# Patient Record
Sex: Male | Born: 1965 | Race: White | Hispanic: No | Marital: Married | Smoking: Never smoker
Health system: Southern US, Community
[De-identification: ages and names within clinical notes are randomized; demographics above are authoritative.]

## PROBLEM LIST (undated history)

## (undated) HISTORY — PX: CRANIECTOMY FOR DEPRESSED SKULL FRACTURE: SHX5788

---

## 2019-08-21 ENCOUNTER — Emergency Department (HOSPITAL_BASED_OUTPATIENT_CLINIC_OR_DEPARTMENT_OTHER)
Admission: EM | Admit: 2019-08-21 | Discharge: 2019-08-21 | Disposition: A | Discharge status: Home / Self Care | Payer: BC Managed Care – PPO | Attending: Emergency Medicine | Admitting: Emergency Medicine

## 2019-08-21 ENCOUNTER — Encounter (HOSPITAL_BASED_OUTPATIENT_CLINIC_OR_DEPARTMENT_OTHER): Payer: Self-pay | Admitting: *Deleted

## 2019-08-21 ENCOUNTER — Emergency Department (HOSPITAL_BASED_OUTPATIENT_CLINIC_OR_DEPARTMENT_OTHER): Payer: BC Managed Care – PPO

## 2019-08-21 ENCOUNTER — Other Ambulatory Visit: Payer: Self-pay

## 2019-08-21 DIAGNOSIS — U071 COVID-19: Secondary | ICD-10-CM | POA: Insufficient documentation

## 2019-08-21 DIAGNOSIS — R0781 Pleurodynia: Secondary | ICD-10-CM | POA: Insufficient documentation

## 2019-08-21 DIAGNOSIS — R0602 Shortness of breath: Secondary | ICD-10-CM | POA: Diagnosis present

## 2019-08-21 DIAGNOSIS — Z79899 Other long term (current) drug therapy: Secondary | ICD-10-CM | POA: Diagnosis not present

## 2019-08-21 LAB — CBC WITH DIFFERENTIAL/PLATELET
Abs Immature Granulocytes: 0.05 10*3/uL (ref 0.00–0.07)
Basophils Absolute: 0 10*3/uL (ref 0.0–0.1)
Basophils Relative: 0 %
Eosinophils Absolute: 0 10*3/uL (ref 0.0–0.5)
Eosinophils Relative: 0 %
HCT: 36.1 % — ABNORMAL LOW (ref 39.0–52.0)
Hemoglobin: 12 g/dL — ABNORMAL LOW (ref 13.0–17.0)
Immature Granulocytes: 1 %
Lymphocytes Relative: 14 %
Lymphs Abs: 1.4 10*3/uL (ref 0.7–4.0)
MCH: 30.2 pg (ref 26.0–34.0)
MCHC: 33.2 g/dL (ref 30.0–36.0)
MCV: 90.9 fL (ref 80.0–100.0)
Monocytes Absolute: 0.6 10*3/uL (ref 0.1–1.0)
Monocytes Relative: 6 %
Neutro Abs: 8 10*3/uL — ABNORMAL HIGH (ref 1.7–7.7)
Neutrophils Relative %: 79 %
Platelets: 389 10*3/uL (ref 150–400)
RBC: 3.97 MIL/uL — ABNORMAL LOW (ref 4.22–5.81)
RDW: 13.4 % (ref 11.5–15.5)
Smear Review: NORMAL
WBC: 10 10*3/uL (ref 4.0–10.5)
nRBC: 0 % (ref 0.0–0.2)

## 2019-08-21 LAB — COMPREHENSIVE METABOLIC PANEL
ALT: 150 U/L — ABNORMAL HIGH (ref 0–44)
AST: 131 U/L — ABNORMAL HIGH (ref 15–41)
Albumin: 3 g/dL — ABNORMAL LOW (ref 3.5–5.0)
Alkaline Phosphatase: 93 U/L (ref 38–126)
Anion gap: 10 (ref 5–15)
BUN: 13 mg/dL (ref 6–20)
CO2: 25 mmol/L (ref 22–32)
Calcium: 8.2 mg/dL — ABNORMAL LOW (ref 8.9–10.3)
Chloride: 101 mmol/L (ref 98–111)
Creatinine, Ser: 1.02 mg/dL (ref 0.61–1.24)
GFR calc Af Amer: >60 mL/min (ref >60)
GFR calc non Af Amer: >60 mL/min (ref >60)
Glucose, Bld: 127 mg/dL — ABNORMAL HIGH (ref 70–99)
Potassium: 3.5 mmol/L (ref 3.5–5.1)
Sodium: 136 mmol/L (ref 135–145)
Total Bilirubin: 0.5 mg/dL (ref 0.3–1.2)
Total Protein: 7 g/dL (ref 6.5–8.1)

## 2019-08-21 LAB — PROCALCITONIN: Procalcitonin: 0.34 ng/mL

## 2019-08-21 LAB — TROPONIN I (HIGH SENSITIVITY): Troponin I (High Sensitivity): 4 ng/L (ref <18)

## 2019-08-21 LAB — LACTATE DEHYDROGENASE: LDH: 392 U/L — ABNORMAL HIGH (ref 98–192)

## 2019-08-21 LAB — FIBRINOGEN: Fibrinogen: >800 mg/dL — ABNORMAL HIGH (ref 210–475)

## 2019-08-21 LAB — LACTIC ACID, PLASMA: Lactic Acid, Venous: 1.8 mmol/L (ref 0.5–1.9)

## 2019-08-21 LAB — TRIGLYCERIDES: Triglycerides: 174 mg/dL — ABNORMAL HIGH (ref <150)

## 2019-08-21 LAB — C-REACTIVE PROTEIN: CRP: 18.1 mg/dL — ABNORMAL HIGH (ref <1.0)

## 2019-08-21 LAB — FERRITIN: Ferritin: 695 ng/mL — ABNORMAL HIGH (ref 24–336)

## 2019-08-21 LAB — D-DIMER, QUANTITATIVE: D-Dimer, Quant: 1.05 ug/mL-FEU — ABNORMAL HIGH (ref 0.00–0.50)

## 2019-08-21 MED ORDER — IOHEXOL 350 MG/ML SOLN
100.0000 mL | Freq: Once | INTRAVENOUS | Status: AC
  Administered 2019-08-21: 100 mL via INTRAVENOUS

## 2019-08-21 MED ORDER — ACETAMINOPHEN 325 MG PO TABS
650.0000 mg | ORAL_TABLET | Freq: Once | ORAL | Status: AC
  Administered 2019-08-21: 650 mg via ORAL
  Filled 2019-08-21: qty 2

## 2019-08-21 MED ORDER — DEXAMETHASONE SODIUM PHOSPHATE 10 MG/ML IJ SOLN
8.0000 mg | Freq: Once | INTRAMUSCULAR | Status: AC
  Administered 2019-08-21: 8 mg via INTRAVENOUS
  Filled 2019-08-21: qty 1

## 2019-08-21 MED ORDER — DEXAMETHASONE 6 MG PO TABS: 6.0000 mg | ORAL_TABLET | Freq: Every day | ORAL | 0 refills | Status: AC

## 2019-08-21 NOTE — ED Notes (Signed)
Pt ambulated around room walking in circles with pulse ox on. Hr increased to 99-104 range and SPO2 decreased to lowest of 90%

## 2019-08-21 NOTE — ED Triage Notes (Signed)
Covid  pos 08/11/2019, SOb x today

## 2019-08-21 NOTE — ED Provider Notes (Signed)
[facility] EMERGENCY DEPARTMENT Provider Note   CSN: [id number] Arrival date & time: 08/21/19  1830    History Chief Complaint  Patient presents with  . Shortness of Breath    Edward Webb is a 54 y.o. male with history significant for craniotomy for depressed skull fracture, known Covid positive on 08/11/2019 from [facility] test presents for evaluation of shortness of breath.  Patient states he has a cough productive of green, clear, brown sputum.  Possible episode of dark hemoptysis however patient is unsure.  He admits to some chest tightness and pleuritic chest pain however no exertional chest pain.  His chest pain does not radiate into his back, left arm, left jaw.  No associated diaphoresis, nausea, vomiting.  Patient states he has been monitoring his oxygen at home which will occasionally drop to 89% and come back up to 92%.  Has been able to tolerate p.o. intake at home.  Mild decreased appetite.  No abdominal pain, dysuria, lateral leg swelling, redness or warmth.  No prior history of PE or DVT.  Denies additional aggravating or alleviating factors.  History obtained from patient and past medical records.  No interpreter is used.  HPI     History reviewed. No pertinent past medical history.  There are no problems to display for this patient.   Past Surgical History:  Procedure Laterality Date  . CRANIECTOMY FOR DEPRESSED SKULL FRACTURE         History reviewed. No pertinent family history.  Social History   Tobacco Use  . Smoking status: Never Smoker  . Smokeless tobacco: Never Used  Substance Use Topics  . Alcohol use: Not Currently  . Drug use: Not Currently    Home Medications Prior to Admission medications   Medication Sig Start Date End Date Taking? Authorizing Provider  testosterone cypionate (DEPOTESTOSTERONE CYPIONATE) 200 MG/ML injection Inject 0.7m every 14 days. 12/27/15  Yes [provider]  dexamethasone (DECADRON) 6 MG tablet  Take 1 tablet (6 mg total) by mouth daily for 5 days. 08/21/19 08/26/19  Kimberlee Shoun A, PA-C    Allergies    Patient has no known allergies.  Review of Systems   Review of Systems  Constitutional: Positive for appetite change, fatigue and fever. Negative for activity change, chills, diaphoresis and unexpected weight change.  HENT: Negative.   Respiratory: Positive for cough and shortness of breath. Negative for apnea, choking, chest tightness, wheezing and stridor.   Cardiovascular: Positive for chest pain (Pleuritic, intermittent with cough). Negative for palpitations and leg swelling.  Gastrointestinal: Negative.   Genitourinary: Negative.   Musculoskeletal: Negative.   Skin: Negative.  Negative for color change.  Neurological: Negative.   All other systems reviewed and are negative.   Physical Exam Updated Vital Signs BP 121/75   Pulse 70   Temp (!) 100.8 F (38.2 C) (Oral)   Resp (!) 25   Ht 5' 10"  (1.778 m)   Wt 104.3 kg   SpO2 93%   BMI 33.00 kg/m   Physical Exam Vitals and nursing note reviewed.  Constitutional:      General: He is not in acute distress.    Appearance: He is not ill-appearing, toxic-appearing or diaphoretic.  HENT:     Head: Normocephalic and atraumatic.     Jaw: There is normal jaw occlusion.     Right Ear: Tympanic membrane, ear canal and external ear normal. There is no impacted cerumen. No hemotympanum. Tympanic membrane is not injected, scarred, perforated, erythematous, retracted or  bulging.     Left Ear: Tympanic membrane, ear canal and external ear normal. There is no impacted cerumen. No hemotympanum. Tympanic membrane is not injected, scarred, perforated, erythematous, retracted or bulging.     Ears:     Comments: No Mastoid tenderness.    Nose:     Comments: Clear rhinorrhea and congestion to bilateral nares.  No sinus tenderness.    Mouth/Throat:     Comments: Posterior oropharynx clear.  Mucous membranes moist.  Tonsils without  erythema or exudate.  Uvula midline without deviation.  No evidence of PTA or RPA.  No drooling, dysphasia or trismus.  Phonation normal. Neck:     Trachea: Trachea and phonation normal.     Meningeal: Brudzinski's sign and Kernig's sign absent.     Comments: No Neck stiffness or neck rigidity.  No meningismus.  No cervical lymphadenopathy. Cardiovascular:     Comments: No murmurs rubs or gallops. Pulmonary:     Comments: Clear to auscultation bilaterally without wheeze, rhonchi or rales.  No accessory muscle usage.  Able speak in full sentences. Abdominal:     Comments: Soft, nontender without rebound or guarding.  No CVA tenderness.  Musculoskeletal:     Comments: Moves all 4 extremities without difficulty.  Lower extremities without edema, erythema or warmth.  Skin:    Comments: Brisk capillary refill.  No rashes or lesions.  Neurological:     Mental Status: He is alert.     Comments: Ambulatory in department without difficulty.  Cranial nerves II through XII grossly intact.  No facial droop.  No aphasia.     ED Results / Procedures / Treatments   Labs (all labs ordered are listed, but only abnormal results are displayed) Labs Reviewed  CBC WITH DIFFERENTIAL/PLATELET - Abnormal; Notable for the following components:      Result Value   RBC 3.97 (*)    Hemoglobin 12.0 (*)    HCT 36.1 (*)    Neutro Abs 8.0 (*)    All other components within normal limits  COMPREHENSIVE METABOLIC PANEL - Abnormal; Notable for the following components:   Glucose, Bld 127 (*)    Calcium 8.2 (*)    Albumin 3.0 (*)    AST 131 (*)    ALT 150 (*)    All other components within normal limits  D-DIMER, QUANTITATIVE (NOT AT [facility]) - Abnormal; Notable for the following components:   D-Dimer, Quant 1.05 (*)    All other components within normal limits  LACTATE DEHYDROGENASE - Abnormal; Notable for the following components:   LDH 392 (*)    All other components within normal limits  FERRITIN -  Abnormal; Notable for the following components:   Ferritin 695 (*)    All other components within normal limits  TRIGLYCERIDES - Abnormal; Notable for the following components:   Triglycerides 174 (*)    All other components within normal limits  FIBRINOGEN - Abnormal; Notable for the following components:   Fibrinogen >800 (*)    All other components within normal limits  C-REACTIVE PROTEIN - Abnormal; Notable for the following components:   CRP 18.1 (*)    All other components within normal limits  CULTURE, BLOOD (ROUTINE X 2)  CULTURE, BLOOD (ROUTINE X 2)  LACTIC ACID, PLASMA  PROCALCITONIN  LACTIC ACID, PLASMA  TROPONIN I (HIGH SENSITIVITY)  TROPONIN I (HIGH SENSITIVITY)    EKG None  Radiology CT Angio Chest PE W/Cm &/Or Wo Cm  Result Date: 08/21/2019 CLINICAL DATA:  COVID-19 positive patient.  Shortness of breath. EXAM: CT ANGIOGRAPHY CHEST WITH CONTRAST TECHNIQUE: Multidetector CT imaging of the chest was performed using the standard protocol during bolus administration of intravenous contrast. Multiplanar CT image reconstructions and MIPs were obtained to evaluate the vascular anatomy. CONTRAST:  100 mL OMNIPAQUE IOHEXOL 350 MG/ML SOLN COMPARISON:  Single-view of the chest earlier today. FINDINGS: Cardiovascular: Satisfactory opacification of the pulmonary arteries to the segmental level. No evidence of pulmonary embolism. Normal heart size. No pericardial effusion. Mediastinum/Nodes: No enlarged mediastinal, hilar, or axillary lymph nodes. Thyroid gland, trachea, and esophagus demonstrate no significant findings. Lungs/Pleura: The patient has extensive multifocal airspace disease consistent with pneumonia. Trace right pleural effusion is noted. Upper Abdomen: No acute or focal abnormality. Musculoskeletal: Negative. Review of the MIP images confirms the above findings. IMPRESSION: Negative for pulmonary embolus. Extensive bilateral airspace disease consistent with pneumonia. The  appearance is in keeping with COVID-19 infection. Electronically Signed   By: Inge Rise M.D.   On: 08/21/2019 21:32   DG Chest Portable 1 View  Result Date: 08/21/2019 CLINICAL DATA:  Shortness of breath, COVID positive EXAM: PORTABLE CHEST 1 VIEW COMPARISON:  None. FINDINGS: Multifocal patchy/interstitial opacities in the bilateral upper lobes, left lower lobe/lingula, and right middle lobe. Relative sparing of the lateral right lung base. This appearance is compatible multifocal pneumonia in this patient with known COVID. No pleural effusion or pneumothorax. The heart is normal in size. IMPRESSION: Multifocal pneumonia in this patient with known COVID, as above. Electronically Signed   By: Julian Hy M.D.   On: 08/21/2019 19:02    Procedures Procedures (including critical care time)  Medications Ordered in ED Medications  acetaminophen (TYLENOL) tablet 650 mg (650 mg Oral Given 08/21/19 1921)  dexamethasone (DECADRON) injection 8 mg (8 mg Intravenous Given 08/21/19 2005)  iohexol (OMNIPAQUE) 350 MG/ML injection 100 mL (100 mLs Intravenous Contrast Given 08/21/19 2054)    ED Course  I have reviewed the triage vital signs and the nursing notes.  Pertinent labs & imaging results that were available during my care of the patient were reviewed by me and considered in my medical decision making (see chart for details).  54 year old male appears otherwise well presents for evaluation of shortness of breath.  Known Covid diagnosis on 06/09/2020.  Patient with low-grade temp, no tachycardia, tachypnea or hypoxia here in the ED.  States he has had possibly one episode of dark hemoptysis.  He is tolerating p.o. intake at home.  Heart and lungs clear.  No evidence of DVT on exam.  Abdomen soft, nontender.  He speaks in full sentences without difficulty.  No acute respiratory distress.  Given possible hemoptysis will obtain D-dimer and possibly CTA to rule a PE given known hypercoagulable state  and Covid patients.  Will ambulate with pulse ox also obtain some basic labs.  Labs and imaging personally reviewed and interpreted:  Clinical Course as of Aug 20 2246  Mon Aug 21, 2019  2109 No leukocytosis, hemoglobin 12.0, no prior to compare her patient denies melena or bright red blood per rectum.  CBC WITH DIFFERENTIAL(!) [BH]  2109 Hyperglycemia to 127, mild elevation in LFTs however bilirubin alk phos negative.  No right upper quadrant tenderness.   Comprehensive metabolic panel(!) [BH]  3235 4  Troponin I (High Sensitivity) [BH]  2110 1.8  Lactic acid, plasma [BH]  2110 Elevated, will order CTA chest  D-dimer, quantitative(!) [BH]  2110 Multifocal pneumonia likely viral due to Covid status.  DG Chest  Portable 1 View [BH]    Clinical Course User Index [BH] Kerly Rigsbee A, PA-C    Patient reassessed.No hypoxia with ambulation, SOB, can carry on conversation without difficulty.  Symptoms likely due to his Covid illness.  No evidence of PE.  Troponin negative here in the ED. EKG without ischemic changes per Dr. Vallery Ridge attending MD.   The patient has been appropriately medically screened and/or stabilized in the ED. I have low suspicion for any other emergent medical condition which would require further screening, evaluation or treatment in the ED or require inpatient management.  Patient is hemodynamically stable and in no acute distress.  Patient able to ambulate in department prior to ED.  Evaluation does not show acute pathology that would require ongoing or additional emergent interventions while in the emergency department or further inpatient treatment.  I have discussed the diagnosis with the patient and answered all questions.  Pain is been managed while in the emergency department and patient has no further complaints prior to discharge.  Patient is comfortable with plan discussed in room and is stable for discharge at this time.  I have discussed strict return  precautions for returning to the emergency department.  Patient was encouraged to follow-up with PCP/specialist refer to at discharge. MDM Rules/Calculators/A&P                      Edward Webb was evaluated in Emergency Department on 08/21/2019 for the symptoms described in the history of present illness. He was evaluated in the context of the global COVID-19 pandemic, which necessitated consideration that the patient might be at risk for infection with the SARS-CoV-2 virus that causes COVID-19. Institutional protocols and algorithms that pertain to the evaluation of patients at risk for COVID-19 are in a state of rapid change based on information released by regulatory bodies including the CDC and federal and state organizations. These policies and algorithms were followed during the patient's care in the ED. Final Clinical Impression(s) / ED Diagnoses Final diagnoses:  COVID-19  SOB (shortness of breath)    Rx / DC Orders ED Discharge Orders         Ordered    dexamethasone (DECADRON) 6 MG tablet  Daily     08/21/19 2233           Edker Punt A, PA-C 08/21/19 2248    Charlesetta Shanks, MD 08/30/19 [phone]

## 2019-08-21 NOTE — Discharge Instructions (Signed)
Take the steroids as prescribed.  If you notice any low oxygen level readings like we talked about during your visit today please seek reevaluation the emergency room

## 2019-08-21 NOTE — ED Notes (Signed)
Transported to CT 

## 2019-08-26 LAB — CULTURE, BLOOD (ROUTINE X 2)
Culture: NO GROWTH
Culture: NO GROWTH
Special Requests: ADEQUATE
Special Requests: ADEQUATE

## 2021-01-24 IMAGING — CT CT ANGIO CHEST
2 of 8 series · 19 of 36 positions shown · IV contrast (Omnipaque)
Comparison: Single-view of the chest earlier today.

CLINICAL DATA: QKDI0-W0 positive patient.  Shortness of breath.

EXAM:
CT ANGIOGRAPHY CHEST WITH CONTRAST
TECHNIQUE: Multidetector CT imaging of the chest was performed using the
standard protocol during bolus administration of intravenous
contrast. Multiplanar CT image reconstructions and MIPs were
obtained to evaluate the vascular anatomy.
CONTRAST:  100 mL OMNIPAQUE IOHEXOL 350 MG/ML SOLN

[Series 6: pe coronal mpr · coronal · 0.61mm/px · 1 of 151 slices shown]
[im 76/151  mediastinal]
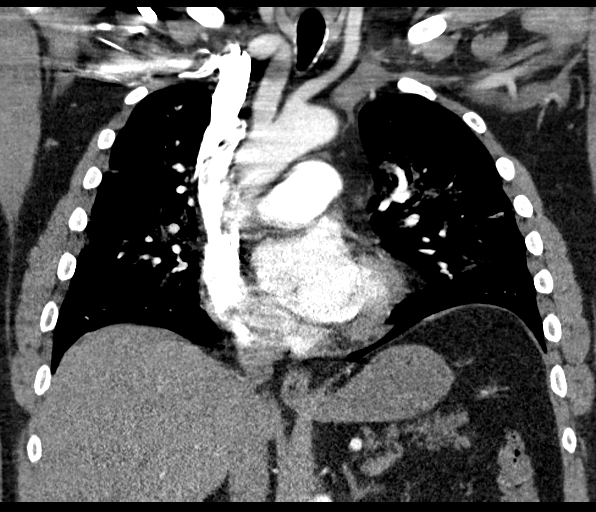

[Series 10: pe thins · axial · 0.78mm/px · z∈[+790,+1058]mm · 18 of 300 slices shown]
[im 16/300  lung]
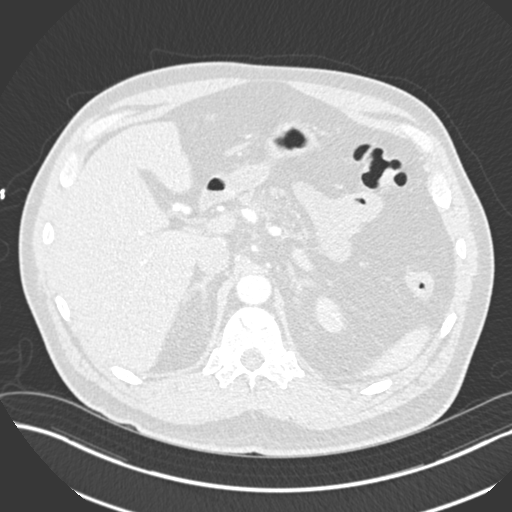
[im 32/300  mediastinal]
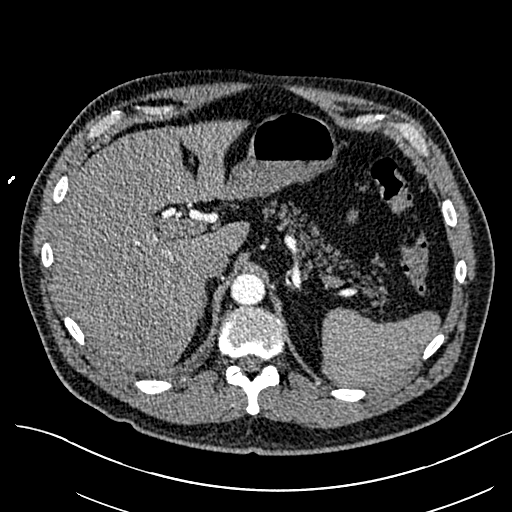
[im 48/300  lung]
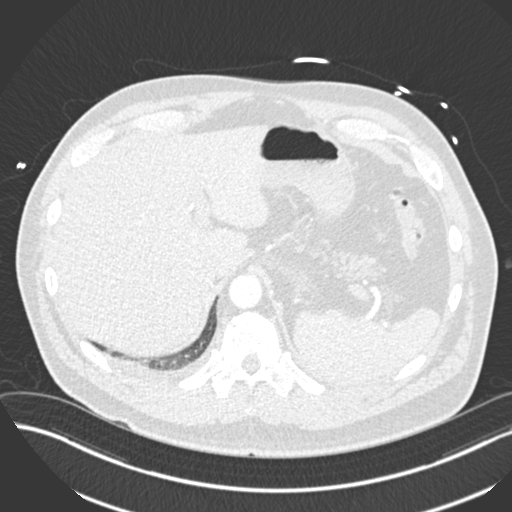
[im 63/300  mediastinal]
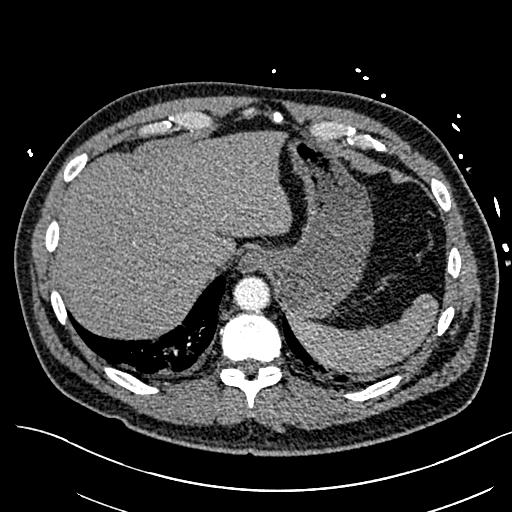
[im 79/300  lung]
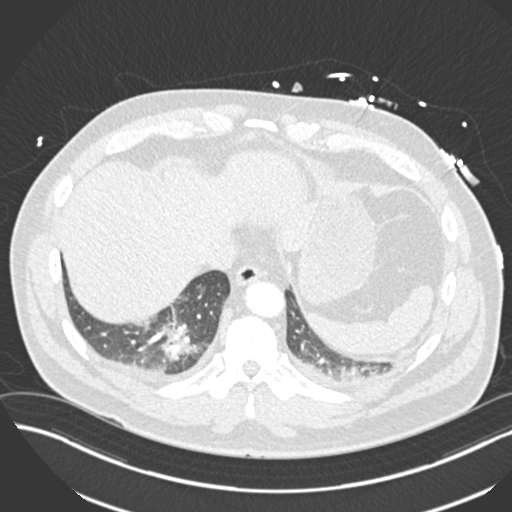
[im 95/300  mediastinal]
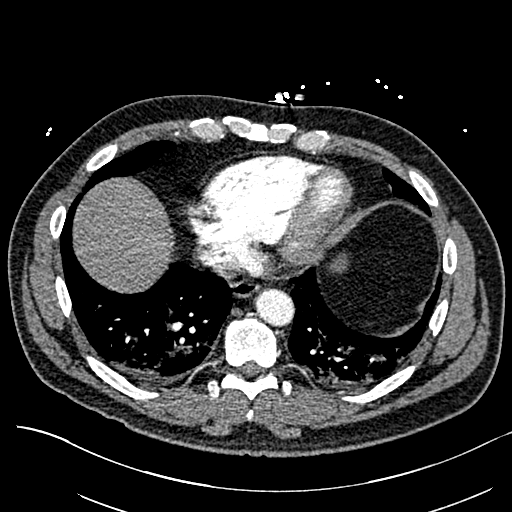
[im 111/300  lung]
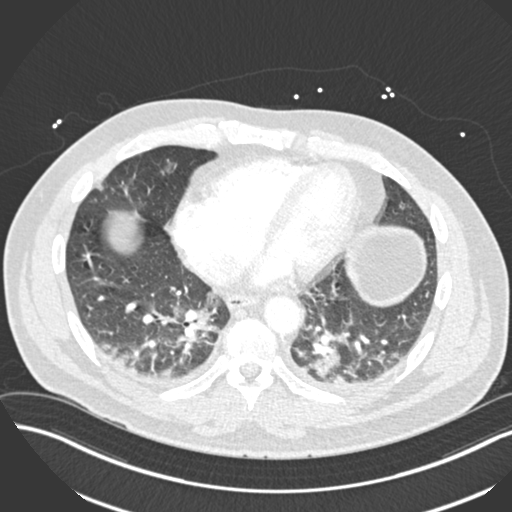
[im 126/300  mediastinal]
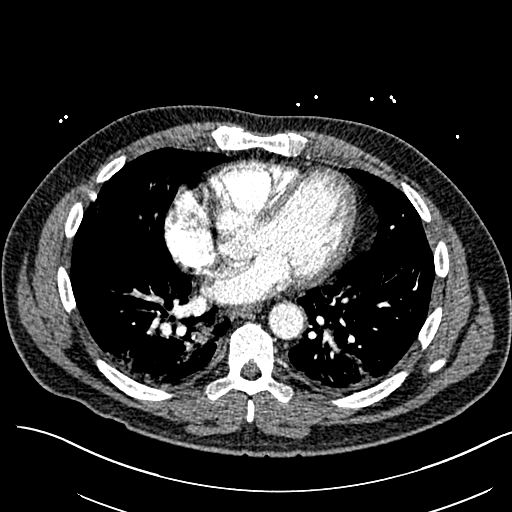
[im 142/300  lung]
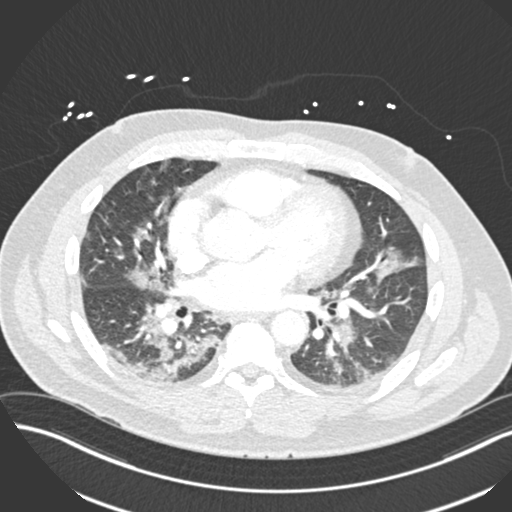
[im 158/300  mediastinal]
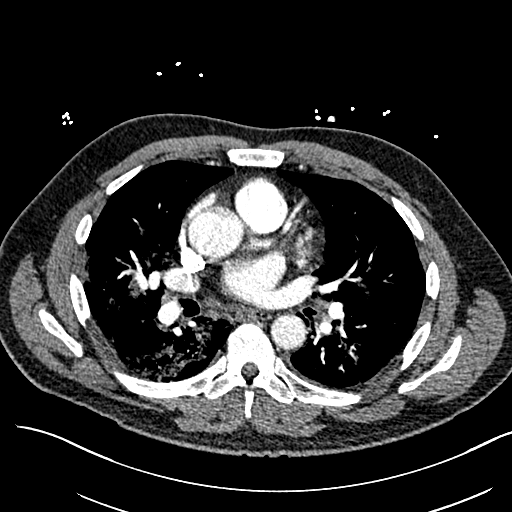
[im 174/300  lung]
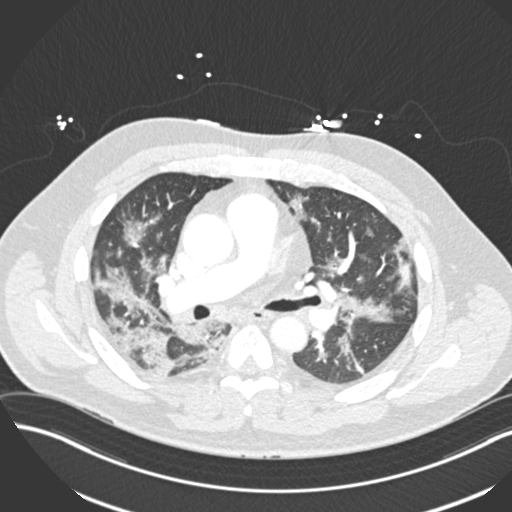
[im 189/300  mediastinal]
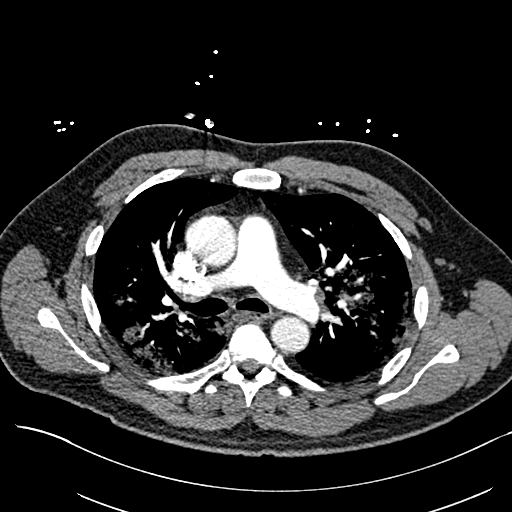
[im 205/300  lung]
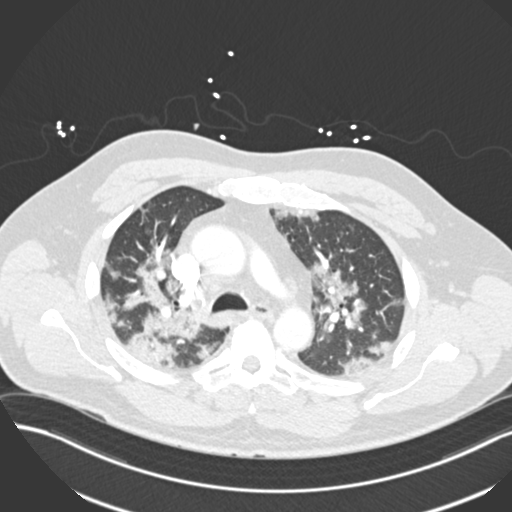
[im 221/300  mediastinal]
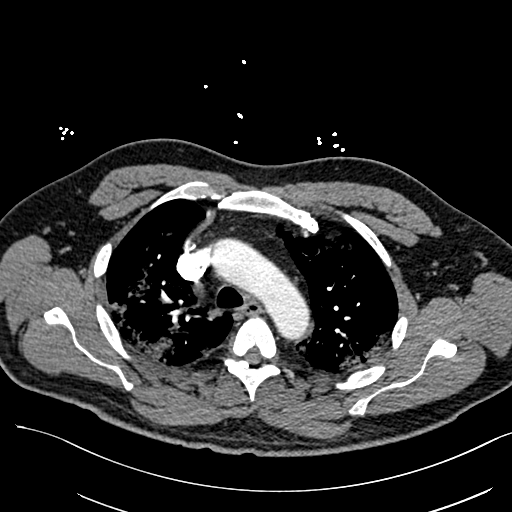
[im 237/300  lung]
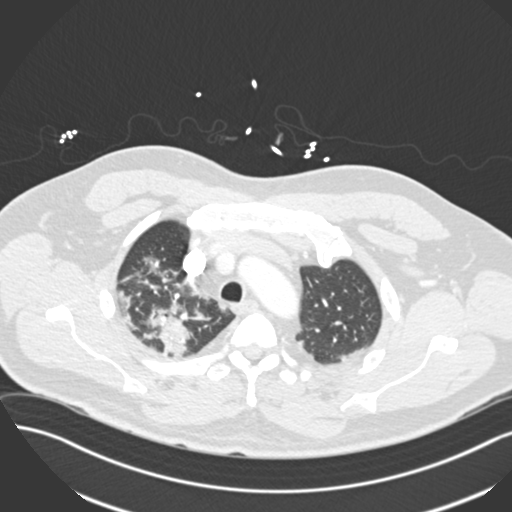
[im 252/300  mediastinal]
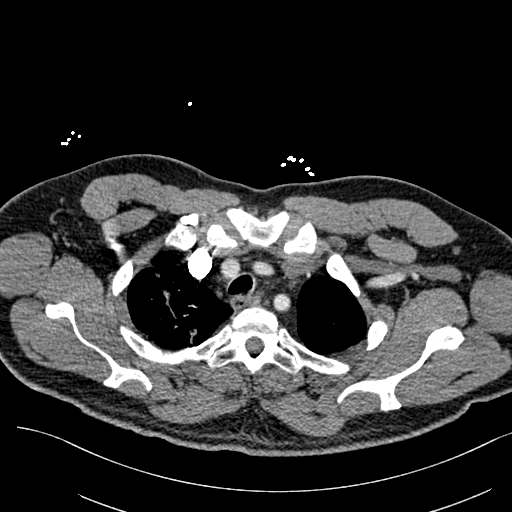
[im 268/300  lung]
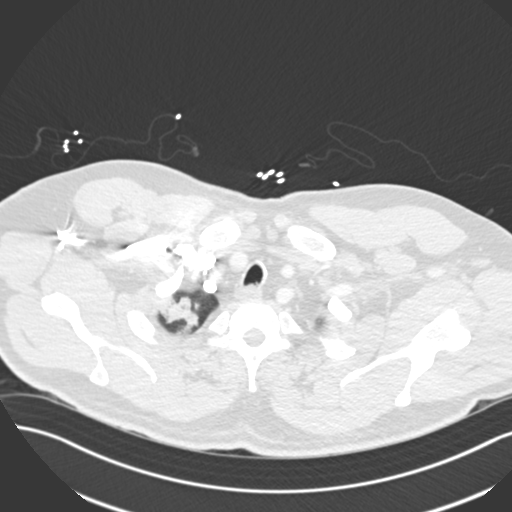
[im 284/300  mediastinal]
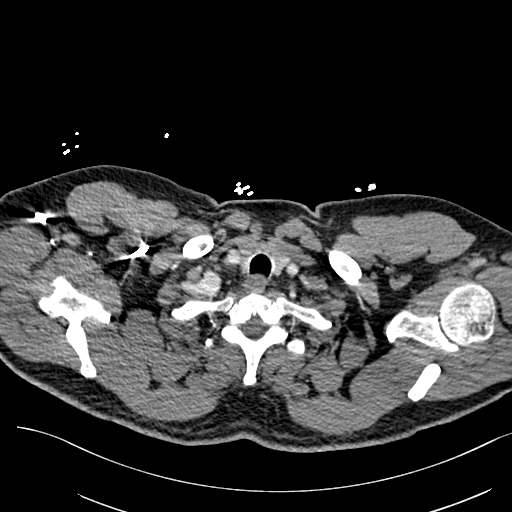

[19 of 36 positions shown; findings below may reference images not displayed]

FINDINGS: Cardiovascular: Satisfactory opacification of the pulmonary arteries
to the segmental level. No evidence of pulmonary embolism. Normal
heart size. No pericardial effusion.

Mediastinum/Nodes: No enlarged mediastinal, hilar, or axillary lymph
nodes. Thyroid gland, trachea, and esophagus demonstrate no
significant findings.

Lungs/Pleura: The patient has extensive multifocal airspace disease
consistent with pneumonia. Trace right pleural effusion is noted.

Upper Abdomen: No acute or focal abnormality.

Musculoskeletal: Negative.

Review of the MIP images confirms the above findings.
IMPRESSION: Negative for pulmonary embolus.

Extensive bilateral airspace disease consistent with pneumonia. The
appearance is in keeping with QKDI0-W0 infection.
